# Patient Record
Sex: Male | Born: 2000 | Race: Black or African American | Hispanic: No | Marital: Single | Smoking: Never smoker
Health system: Southern US, Community
[De-identification: ages and names within clinical notes are randomized; demographics above are authoritative.]

## PROBLEM LIST (undated history)

## (undated) DIAGNOSIS — Z789 Other specified health status: Secondary | ICD-10-CM

## (undated) DIAGNOSIS — F909 Attention-deficit hyperactivity disorder, unspecified type: Secondary | ICD-10-CM

## (undated) DIAGNOSIS — F329 Major depressive disorder, single episode, unspecified: Secondary | ICD-10-CM

---
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
## 2019-10-03 ENCOUNTER — Other Ambulatory Visit: Payer: Self-pay

## 2019-10-03 ENCOUNTER — Ambulatory Visit (HOSPITAL_COMMUNITY)
Admission: EM | Admit: 2019-10-03 | Discharge: 2019-10-03 | Disposition: A | Discharge status: Home / Self Care | Payer: Medicaid Other | Attending: Family Medicine | Admitting: Family Medicine

## 2019-10-03 ENCOUNTER — Encounter (HOSPITAL_COMMUNITY): Payer: Self-pay

## 2019-10-03 DIAGNOSIS — R430 Anosmia: Secondary | ICD-10-CM | POA: Diagnosis present

## 2019-10-03 DIAGNOSIS — Z20822 Contact with and (suspected) exposure to covid-19: Secondary | ICD-10-CM | POA: Diagnosis not present

## 2019-10-03 DIAGNOSIS — U071 COVID-19: Secondary | ICD-10-CM | POA: Insufficient documentation

## 2019-10-03 DIAGNOSIS — Z1152 Encounter for screening for COVID-19: Secondary | ICD-10-CM | POA: Diagnosis not present

## 2019-10-03 NOTE — ED Triage Notes (Signed)
Patient reports he had a possible exposure to a covid positive patient. Also reports he has not been able to smell anything for 1.5-2 weeks.
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
## 2019-10-03 NOTE — Discharge Instructions (Addendum)
Your COVID test is pending.  You should self quarantine until the test result is back.    Take Tylenol as needed for fever or discomfort.  Rest and keep yourself hydrated.    Go to the emergency department if you develop shortness of breath, severe diarrhea, high fever not relieved by Tylenol or ibuprofen, or other concerning symptoms.    

## 2019-10-04 LAB — SARS CORONAVIRUS 2 (TAT 6-24 HRS): SARS Coronavirus 2: POSITIVE — AB
